# Patient Record
Sex: Male | Born: 2000
Health system: Southern US, Community
[De-identification: ages and names within clinical notes are randomized; demographics above are authoritative.]

## PROBLEM LIST (undated history)

## (undated) DIAGNOSIS — Z9109 Other allergy status, other than to drugs and biological substances: Secondary | ICD-10-CM

## (undated) DIAGNOSIS — R51 Headache: Secondary | ICD-10-CM

## (undated) HISTORY — DX: Headache: R51

---
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
## 2020-01-17 DIAGNOSIS — Z23 Encounter for immunization: Secondary | ICD-10-CM | POA: Diagnosis not present

## 2020-04-23 DIAGNOSIS — Z713 Dietary counseling and surveillance: Secondary | ICD-10-CM | POA: Diagnosis not present

## 2020-04-23 DIAGNOSIS — Z68.41 Body mass index (BMI) pediatric, less than 5th percentile for age: Secondary | ICD-10-CM | POA: Diagnosis not present

## 2020-04-23 DIAGNOSIS — Z7189 Other specified counseling: Secondary | ICD-10-CM | POA: Diagnosis not present

## 2020-04-23 DIAGNOSIS — Z0000 Encounter for general adult medical examination without abnormal findings: Secondary | ICD-10-CM | POA: Diagnosis not present

## 2020-04-29 ENCOUNTER — Other Ambulatory Visit: Payer: 59

## 2020-04-30 ENCOUNTER — Other Ambulatory Visit: Payer: 59

## 2020-04-30 DIAGNOSIS — Z20822 Contact with and (suspected) exposure to covid-19: Secondary | ICD-10-CM

## 2020-05-01 LAB — NOVEL CORONAVIRUS, NAA: SARS-CoV-2, NAA: NOT DETECTED

## 2020-05-01 LAB — SARS-COV-2, NAA 2 DAY TAT

## 2020-10-17 ENCOUNTER — Encounter (INDEPENDENT_AMBULATORY_CARE_PROVIDER_SITE_OTHER): Payer: Self-pay

## 2021-11-11 DIAGNOSIS — Z0000 Encounter for general adult medical examination without abnormal findings: Secondary | ICD-10-CM | POA: Diagnosis not present

## 2021-11-11 DIAGNOSIS — Z8669 Personal history of other diseases of the nervous system and sense organs: Secondary | ICD-10-CM | POA: Diagnosis not present

## 2023-03-16 DIAGNOSIS — Z8669 Personal history of other diseases of the nervous system and sense organs: Secondary | ICD-10-CM | POA: Diagnosis not present

## 2023-03-16 DIAGNOSIS — Z0000 Encounter for general adult medical examination without abnormal findings: Secondary | ICD-10-CM | POA: Diagnosis not present

## 2023-03-16 DIAGNOSIS — Z23 Encounter for immunization: Secondary | ICD-10-CM | POA: Diagnosis not present

## 2023-03-30 DIAGNOSIS — Z23 Encounter for immunization: Secondary | ICD-10-CM | POA: Diagnosis not present
